# Patient Record
Sex: Male | Born: 2000 | Race: White | Hispanic: Yes | Marital: Single | Smoking: Never smoker
Health system: Southern US, Community
[De-identification: ages and names within clinical notes are randomized; demographics above are authoritative.]

## PROBLEM LIST (undated history)

## (undated) HISTORY — PX: NO PAST SURGERIES: SHX2092

---
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
## 2021-01-07 DIAGNOSIS — F330 Major depressive disorder, recurrent, mild: Secondary | ICD-10-CM | POA: Diagnosis not present

## 2021-01-14 DIAGNOSIS — F330 Major depressive disorder, recurrent, mild: Secondary | ICD-10-CM | POA: Diagnosis not present

## 2021-01-21 DIAGNOSIS — F330 Major depressive disorder, recurrent, mild: Secondary | ICD-10-CM | POA: Diagnosis not present

## 2021-01-31 DIAGNOSIS — F330 Major depressive disorder, recurrent, mild: Secondary | ICD-10-CM | POA: Diagnosis not present

## 2021-02-11 DIAGNOSIS — F330 Major depressive disorder, recurrent, mild: Secondary | ICD-10-CM | POA: Diagnosis not present

## 2021-03-03 DIAGNOSIS — F330 Major depressive disorder, recurrent, mild: Secondary | ICD-10-CM | POA: Diagnosis not present

## 2021-03-18 DIAGNOSIS — F330 Major depressive disorder, recurrent, mild: Secondary | ICD-10-CM | POA: Diagnosis not present

## 2021-03-25 DIAGNOSIS — F330 Major depressive disorder, recurrent, mild: Secondary | ICD-10-CM | POA: Diagnosis not present

## 2021-04-08 DIAGNOSIS — F330 Major depressive disorder, recurrent, mild: Secondary | ICD-10-CM | POA: Diagnosis not present

## 2021-04-15 DIAGNOSIS — F330 Major depressive disorder, recurrent, mild: Secondary | ICD-10-CM | POA: Diagnosis not present

## 2021-04-22 DIAGNOSIS — F330 Major depressive disorder, recurrent, mild: Secondary | ICD-10-CM | POA: Diagnosis not present

## 2021-04-29 DIAGNOSIS — F330 Major depressive disorder, recurrent, mild: Secondary | ICD-10-CM | POA: Diagnosis not present

## 2021-05-06 DIAGNOSIS — F330 Major depressive disorder, recurrent, mild: Secondary | ICD-10-CM | POA: Diagnosis not present

## 2021-05-20 DIAGNOSIS — F330 Major depressive disorder, recurrent, mild: Secondary | ICD-10-CM | POA: Diagnosis not present

## 2021-05-27 DIAGNOSIS — F330 Major depressive disorder, recurrent, mild: Secondary | ICD-10-CM | POA: Diagnosis not present

## 2021-06-03 DIAGNOSIS — F330 Major depressive disorder, recurrent, mild: Secondary | ICD-10-CM | POA: Diagnosis not present

## 2021-06-10 DIAGNOSIS — F330 Major depressive disorder, recurrent, mild: Secondary | ICD-10-CM | POA: Diagnosis not present

## 2021-06-17 DIAGNOSIS — F330 Major depressive disorder, recurrent, mild: Secondary | ICD-10-CM | POA: Diagnosis not present

## 2021-06-24 DIAGNOSIS — F330 Major depressive disorder, recurrent, mild: Secondary | ICD-10-CM | POA: Diagnosis not present

## 2021-07-08 DIAGNOSIS — F330 Major depressive disorder, recurrent, mild: Secondary | ICD-10-CM | POA: Diagnosis not present

## 2021-07-15 DIAGNOSIS — F330 Major depressive disorder, recurrent, mild: Secondary | ICD-10-CM | POA: Diagnosis not present

## 2021-07-26 DIAGNOSIS — F330 Major depressive disorder, recurrent, mild: Secondary | ICD-10-CM | POA: Diagnosis not present

## 2021-08-05 DIAGNOSIS — F330 Major depressive disorder, recurrent, mild: Secondary | ICD-10-CM | POA: Diagnosis not present

## 2021-08-12 DIAGNOSIS — F330 Major depressive disorder, recurrent, mild: Secondary | ICD-10-CM | POA: Diagnosis not present

## 2021-08-19 DIAGNOSIS — F330 Major depressive disorder, recurrent, mild: Secondary | ICD-10-CM | POA: Diagnosis not present

## 2021-08-22 DIAGNOSIS — F330 Major depressive disorder, recurrent, mild: Secondary | ICD-10-CM | POA: Diagnosis not present

## 2021-09-05 DIAGNOSIS — F330 Major depressive disorder, recurrent, mild: Secondary | ICD-10-CM | POA: Diagnosis not present

## 2021-09-19 DIAGNOSIS — F330 Major depressive disorder, recurrent, mild: Secondary | ICD-10-CM | POA: Diagnosis not present

## 2021-10-24 DIAGNOSIS — F330 Major depressive disorder, recurrent, mild: Secondary | ICD-10-CM | POA: Diagnosis not present

## 2021-10-31 DIAGNOSIS — F330 Major depressive disorder, recurrent, mild: Secondary | ICD-10-CM | POA: Diagnosis not present

## 2021-11-21 DIAGNOSIS — F330 Major depressive disorder, recurrent, mild: Secondary | ICD-10-CM | POA: Diagnosis not present

## 2021-11-28 DIAGNOSIS — F330 Major depressive disorder, recurrent, mild: Secondary | ICD-10-CM | POA: Diagnosis not present

## 2021-12-05 DIAGNOSIS — F330 Major depressive disorder, recurrent, mild: Secondary | ICD-10-CM | POA: Diagnosis not present

## 2021-12-12 DIAGNOSIS — F330 Major depressive disorder, recurrent, mild: Secondary | ICD-10-CM | POA: Diagnosis not present

## 2021-12-19 DIAGNOSIS — F330 Major depressive disorder, recurrent, mild: Secondary | ICD-10-CM | POA: Diagnosis not present

## 2021-12-26 DIAGNOSIS — F330 Major depressive disorder, recurrent, mild: Secondary | ICD-10-CM | POA: Diagnosis not present

## 2022-01-01 ENCOUNTER — Ambulatory Visit (INDEPENDENT_AMBULATORY_CARE_PROVIDER_SITE_OTHER): Payer: BLUE CROSS/BLUE SHIELD

## 2022-01-01 ENCOUNTER — Encounter: Payer: Self-pay | Admitting: Emergency Medicine

## 2022-01-01 ENCOUNTER — Ambulatory Visit
Admission: EM | Admit: 2022-01-01 | Discharge: 2022-01-01 | Disposition: A | Discharge status: Home / Self Care | Payer: BLUE CROSS/BLUE SHIELD | Attending: Family Medicine | Admitting: Family Medicine

## 2022-01-01 DIAGNOSIS — M25511 Pain in right shoulder: Secondary | ICD-10-CM

## 2022-01-01 DIAGNOSIS — S4991XA Unspecified injury of right shoulder and upper arm, initial encounter: Secondary | ICD-10-CM | POA: Diagnosis not present

## 2022-01-01 MED ORDER — NAPROXEN 500 MG PO TABS: 500.0000 mg | ORAL_TABLET | Freq: Two times a day (BID) | ORAL | 0 refills | Status: AC

## 2022-01-01 MED ORDER — METHOCARBAMOL 500 MG PO TABS: 500.0000 mg | ORAL_TABLET | Freq: Two times a day (BID) | ORAL | 0 refills | Status: AC

## 2022-01-01 NOTE — Discharge Instructions (Addendum)
There is no fracture or dislocation of your right shoulder seen on the x-rays.  Likely injury to the muscles during your jujitsu match.  I sent some pain medication called Naprosyn to the pharmacy.  Not take any over-the-counter medications except for Tylenol with this medication.  I also sent a muscle relaxer called Robaxin/methocarbamol.  Do not drive or operate heavy machinery while taking muscle relaxers.  Follow-up with Dr. Rosette Reveal if pain is not improved in the next 2 weeks.  Phone number: [phone].  Call to schedule an appointment.

## 2022-01-01 NOTE — ED Triage Notes (Signed)
Patient was doing a jujitsu move and hit his head on the mat and landed on his right shoulder yesterday.  Patient states that his head no longer hurts but that his right shoulder is still hurting.  Patient denies LOC.

## 2022-01-01 NOTE — ED Provider Notes (Signed)
[facility]    CSN: [id number] Arrival date & time: 01/01/22  1032      History   Chief Complaint Chief Complaint  Patient presents with   Shoulder Injury    right   Head Injury    HPI  HPI Jesse Snyder is a 21 y.o. male.   Dradenp presents for right shoulder pain after injury yesterday during jujitsu.  States that he was wrestling someone on the mat and that person landed on him with impact to his shoulder.  He is concerned that he may have dislocated it.  No abnormal Pap or sounds were heard.  Continues to have pain with movement.  He has tried over-the-counter analgesics without relief.  Has some neck tightness and mild upper back pain as well.  He just wants to be sure that he did not dislocate his shoulder.  He is right-handed.  Has no other concerns today and has otherwise been well.    History reviewed. No pertinent past medical history.  There are no problems to display for this patient.   Past Surgical History:  Procedure Laterality Date   NO PAST SURGERIES         Home Medications    Prior to Admission medications   Medication Sig Start Date End Date Taking? Authorizing Provider  methocarbamol (ROBAXIN) 500 MG tablet Take 1 tablet (500 mg total) by mouth 2 (two) times daily. 01/01/22  Yes Khristina Janota, DO  naproxen (NAPROSYN) 500 MG tablet Take 1 tablet (500 mg total) by mouth 2 (two) times daily. 01/01/22  Yes Katha Cabal, DO    Family History Family History  Problem Relation Age of Onset   Depression Mother    Healthy Father     Social History Social History   Tobacco Use   Smoking status: Never   Smokeless tobacco: Never  Vaping Use   Vaping Use: Never used  Substance Use Topics   Alcohol use: No   Drug use: No     Allergies   Patient has no known allergies.   Review of Systems Review of Systems:negative unless otherwise stated in HPI.      Physical Exam Triage Vital Signs ED Triage Vitals   Enc Vitals Group     BP 01/01/22 1117 (!) 139/93     Pulse Rate 01/01/22 1117 73     Resp 01/01/22 1117 15     Temp 01/01/22 1117 98.2 F (36.8 C)     Temp Source 01/01/22 1117 Oral     SpO2 01/01/22 1117 100 %     Weight 01/01/22 1115 190 lb (86.2 kg)     Height 01/01/22 1115 5\' 9"  (1.753 m)     Head Circumference --      Peak Flow --      Pain Score 01/01/22 1115 3     Pain Loc --      Pain Edu? --      Excl. in GC? --    No data found.  Updated Vital Signs BP (!) 139/93 (BP Location: Left Arm)   Pulse 73   Temp 98.2 F (36.8 C) (Oral)   Resp 15   Ht 5\' 9"  (1.753 m)   Wt 86.2 kg   SpO2 100%   BMI 28.06 kg/m   Visual Acuity Right Eye Distance:   Left Eye Distance:   Bilateral Distance:    Right Eye Near:   Left Eye Near:    Bilateral Near:  Physical Exam GEN: well appearing male in no acute distress  NECK: normal ROM, + paraspinal hypertonicity, no tenderness  CVS: well perfused  RESP: speaking in full sentences without pause, no respiratory distress  MSK: right shoulder:  No evidence of bony deformity, asymmetry, or muscle atrophy. No tenderness over long head of biceps (bicipital groove).  No TTP at [facility] joint.  Full active and passive (ABD, ADD, Flexion, extension, IR, ER). Strength 5/5 grip, elbow and shoulder. No abnormal scapular function observed.  Special Tests: Hawkins: Negative; Empty Can: Negative, Neer's: Negative; Painful arc: Negative; Anterior Apprehension: Negative Sensation intact. Peripheral pulses intact.   UC Treatments / Results  Labs (all labs ordered are listed, but only abnormal results are displayed) Labs Reviewed - No data to display  EKG   Radiology DG Shoulder Right  Result Date: 01/01/2022 CLINICAL DATA:  Trauma, pain EXAM: RIGHT SHOULDER - 2+ VIEW COMPARISON:  None Available. FINDINGS: There is no evidence of fracture or dislocation. There is no evidence of arthropathy or other focal bone abnormality. Soft tissues  are unremarkable. IMPRESSION: No fracture or dislocation is seen in right shoulder. Electronically Signed   By: Elmer Picker M.D.   On: 01/01/2022 11:45    Procedures Procedures (including critical care time)  Medications Ordered in UC Medications - No data to display  Initial Impression / Assessment and Plan / UC Course  I have reviewed the triage vital signs and the nursing notes.  Pertinent labs & imaging results that were available during my care of the patient were reviewed by me and considered in my medical decision making (see chart for details).     Pt is a 21 y.o.  male with 2 days of right shoulder pain after jujitsu injury.    Obtained right shoulder films.  Personally reviewed by me were  unremarkable for fracture or dislocation.   Radiologist agrees.  Patient to gradually return to normal activities, as tolerated and continue ordinary activities within the limits permitted by pain. Prescribed Naproxen sodium   and muscle relaxer   for pain relief.  Tylenol PRN. Advised patient to avoid other NSAIDs while taking Naprosyn. Counseled patient on red flag symptoms and when to seek immediate care.   No red flags such as progressive major motor weakness.   Patient to follow up with orthopedic provider if symptoms do not improve with conservative treatment.  Return and ED precautions given.   Discussed MDM, treatment plan and plan for follow-up with patient/parent who agrees with plan.   Final Clinical Impressions(s) / UC Diagnoses   Final diagnoses:  Acute pain of right shoulder     Discharge Instructions      There is no fracture or dislocation of your right shoulder seen on the x-rays.  Likely injury to the muscles during your jujitsu match.  I sent some pain medication called Naprosyn to the pharmacy.  Not take any over-the-counter medications except for Tylenol with this medication.  I also sent a muscle relaxer called Robaxin/methocarbamol.  Do not drive or  operate heavy machinery while taking muscle relaxers.  Follow-up with Dr. Rosette Reveal if pain is not improved in the next 2 weeks.  Phone number: [phone].  Call to schedule an appointment.     ED Prescriptions     Medication Sig Dispense Auth. Provider   naproxen (NAPROSYN) 500 MG tablet Take 1 tablet (500 mg total) by mouth 2 (two) times daily. 30 tablet Quashawn Jewkes, DO   methocarbamol (ROBAXIN) 500 MG  tablet Take 1 tablet (500 mg total) by mouth 2 (two) times daily. 20 tablet Katha Cabal, DO      PDMP not reviewed this encounter.   Katha Cabal, DO 01/01/22 1157

## 2022-01-09 DIAGNOSIS — F330 Major depressive disorder, recurrent, mild: Secondary | ICD-10-CM | POA: Diagnosis not present

## 2022-01-16 DIAGNOSIS — F330 Major depressive disorder, recurrent, mild: Secondary | ICD-10-CM | POA: Diagnosis not present

## 2022-01-23 DIAGNOSIS — F330 Major depressive disorder, recurrent, mild: Secondary | ICD-10-CM | POA: Diagnosis not present

## 2022-01-30 DIAGNOSIS — F330 Major depressive disorder, recurrent, mild: Secondary | ICD-10-CM | POA: Diagnosis not present

## 2022-02-06 DIAGNOSIS — F330 Major depressive disorder, recurrent, mild: Secondary | ICD-10-CM | POA: Diagnosis not present

## 2022-02-10 ENCOUNTER — Ambulatory Visit (INDEPENDENT_AMBULATORY_CARE_PROVIDER_SITE_OTHER): Payer: BLUE CROSS/BLUE SHIELD | Admitting: Family Medicine

## 2022-02-10 ENCOUNTER — Encounter: Payer: Self-pay | Admitting: Family Medicine

## 2022-02-10 VITALS — BP 128/88 | HR 90 | Ht 70.0 in | Wt 211.0 lb

## 2022-02-10 DIAGNOSIS — S4351XA Sprain of right acromioclavicular joint, initial encounter: Secondary | ICD-10-CM | POA: Diagnosis not present

## 2022-02-10 NOTE — Patient Instructions (Signed)
-   Start home exercises with information provided and focus on steady advance - Can use topical NSAID Voltaren gel 1% up to 4 times / day as-needed - Contact us towards end of this year / early next year for any persistent pain to discuss next steps - Can return to martial arts in 2 weeks if symptoms controlled / resolving / resolved

## 2022-02-13 DIAGNOSIS — F330 Major depressive disorder, recurrent, mild: Secondary | ICD-10-CM | POA: Diagnosis not present

## 2022-02-15 DIAGNOSIS — S4351XA Sprain of right acromioclavicular joint, initial encounter: Secondary | ICD-10-CM | POA: Insufficient documentation

## 2022-02-15 NOTE — Progress Notes (Signed)
     Primary Care / Sports Medicine Office Visit  Patient Information:  Patient ID: Jesse Snyder, male DOB: 2000/11/09 Age: 21 y.o. MRN: [id number]   Jesse Snyder is a pleasant 21 y.o. male presenting with the following:  Chief Complaint  Patient presents with   Shoulder Injury    Right, was doing  [employer] Jitsu and someone landed on it, went to UC was told muscle injury and to follow up here, got Covid in between     Vitals:   02/10/22 1514  BP: 128/88  Pulse: 90  SpO2: 98%   Vitals:   02/10/22 1514  Weight: 211 lb (95.7 kg)  Height: 5\' 10"  (1.778 m)   Body mass index is 30.28 kg/m.  No results found.   Independent interpretation of notes and tests performed by another provider:   Independent interpretation of right shoulder x-rays demonstrates no acute osseous process, no clavicular elevation, alignment maintained.  Procedures performed:   None  Pertinent History, Exam, Impression, and Recommendations:   Problem List Items Addressed This Visit       Musculoskeletal and Integument   Acromioclavicular sprain, right, initial encounter - Primary    RHD patient presenting with acute and traumatic right anterior shoulder pain after grappling during jiujitsu, describes impact to lateral right shoulder, pain at onset. Went to urgent care where x-rays obtained, advised follow-up. Examination localizes to [facility] joint, no crepitus, no hemielevation, RC testing and examination otherwise benign. X-rays reviewed and reassuring, no comparison view contralaterally.  I have advised supportive care, home-based rehab, and follow-up criteria.        Orders & Medications No orders of the defined types were placed in this encounter.  No orders of the defined types were placed in this encounter.    No follow-ups on file.     [facility], MD, [facility]   Primary Care Sports Medicine Primary Care and Sports Medicine at [facility]

## 2022-02-15 NOTE — Assessment & Plan Note (Signed)
RHD patient presenting with acute and traumatic right anterior shoulder pain after grappling during jiujitsu, describes impact to lateral right shoulder, pain at onset. Went to urgent care where x-rays obtained, advised follow-up. Examination localizes to [facility] joint, no crepitus, no hemielevation, RC testing and examination otherwise benign. X-rays reviewed and reassuring, no comparison view contralaterally.  I have advised supportive care, home-based rehab, and follow-up criteria.

## 2022-02-20 DIAGNOSIS — F330 Major depressive disorder, recurrent, mild: Secondary | ICD-10-CM | POA: Diagnosis not present

## 2022-02-27 DIAGNOSIS — F330 Major depressive disorder, recurrent, mild: Secondary | ICD-10-CM | POA: Diagnosis not present

## 2022-03-20 DIAGNOSIS — F330 Major depressive disorder, recurrent, mild: Secondary | ICD-10-CM | POA: Diagnosis not present

## 2022-03-27 DIAGNOSIS — F330 Major depressive disorder, recurrent, mild: Secondary | ICD-10-CM | POA: Diagnosis not present

## 2022-04-03 DIAGNOSIS — F330 Major depressive disorder, recurrent, mild: Secondary | ICD-10-CM | POA: Diagnosis not present

## 2022-04-10 DIAGNOSIS — F330 Major depressive disorder, recurrent, mild: Secondary | ICD-10-CM | POA: Diagnosis not present

## 2022-04-17 DIAGNOSIS — F330 Major depressive disorder, recurrent, mild: Secondary | ICD-10-CM | POA: Diagnosis not present

## 2022-04-24 DIAGNOSIS — F330 Major depressive disorder, recurrent, mild: Secondary | ICD-10-CM | POA: Diagnosis not present

## 2022-05-01 DIAGNOSIS — F330 Major depressive disorder, recurrent, mild: Secondary | ICD-10-CM | POA: Diagnosis not present

## 2022-05-08 DIAGNOSIS — F330 Major depressive disorder, recurrent, mild: Secondary | ICD-10-CM | POA: Diagnosis not present

## 2022-05-22 DIAGNOSIS — F330 Major depressive disorder, recurrent, mild: Secondary | ICD-10-CM | POA: Diagnosis not present

## 2022-05-29 DIAGNOSIS — F330 Major depressive disorder, recurrent, mild: Secondary | ICD-10-CM | POA: Diagnosis not present

## 2022-06-05 DIAGNOSIS — F330 Major depressive disorder, recurrent, mild: Secondary | ICD-10-CM | POA: Diagnosis not present

## 2022-06-12 DIAGNOSIS — F330 Major depressive disorder, recurrent, mild: Secondary | ICD-10-CM | POA: Diagnosis not present

## 2022-06-19 DIAGNOSIS — F330 Major depressive disorder, recurrent, mild: Secondary | ICD-10-CM | POA: Diagnosis not present

## 2022-07-17 DIAGNOSIS — F330 Major depressive disorder, recurrent, mild: Secondary | ICD-10-CM | POA: Diagnosis not present

## 2022-07-31 DIAGNOSIS — F330 Major depressive disorder, recurrent, mild: Secondary | ICD-10-CM | POA: Diagnosis not present

## 2022-08-14 DIAGNOSIS — F330 Major depressive disorder, recurrent, mild: Secondary | ICD-10-CM | POA: Diagnosis not present

## 2022-08-28 DIAGNOSIS — F330 Major depressive disorder, recurrent, mild: Secondary | ICD-10-CM | POA: Diagnosis not present

## 2022-09-25 DIAGNOSIS — F330 Major depressive disorder, recurrent, mild: Secondary | ICD-10-CM | POA: Diagnosis not present

## 2022-10-09 DIAGNOSIS — F330 Major depressive disorder, recurrent, mild: Secondary | ICD-10-CM | POA: Diagnosis not present

## 2022-10-23 DIAGNOSIS — F330 Major depressive disorder, recurrent, mild: Secondary | ICD-10-CM | POA: Diagnosis not present

## 2022-11-20 DIAGNOSIS — F330 Major depressive disorder, recurrent, mild: Secondary | ICD-10-CM | POA: Diagnosis not present

## 2022-12-04 DIAGNOSIS — F330 Major depressive disorder, recurrent, mild: Secondary | ICD-10-CM | POA: Diagnosis not present

## 2022-12-18 DIAGNOSIS — F330 Major depressive disorder, recurrent, mild: Secondary | ICD-10-CM | POA: Diagnosis not present

## 2023-01-01 DIAGNOSIS — F330 Major depressive disorder, recurrent, mild: Secondary | ICD-10-CM | POA: Diagnosis not present

## 2023-01-15 DIAGNOSIS — F330 Major depressive disorder, recurrent, mild: Secondary | ICD-10-CM | POA: Diagnosis not present

## 2023-01-29 DIAGNOSIS — F330 Major depressive disorder, recurrent, mild: Secondary | ICD-10-CM | POA: Diagnosis not present

## 2023-02-12 DIAGNOSIS — F330 Major depressive disorder, recurrent, mild: Secondary | ICD-10-CM | POA: Diagnosis not present

## 2023-02-26 DIAGNOSIS — F330 Major depressive disorder, recurrent, mild: Secondary | ICD-10-CM | POA: Diagnosis not present

## 2023-03-26 DIAGNOSIS — F330 Major depressive disorder, recurrent, mild: Secondary | ICD-10-CM | POA: Diagnosis not present

## 2023-04-09 DIAGNOSIS — F330 Major depressive disorder, recurrent, mild: Secondary | ICD-10-CM | POA: Diagnosis not present

## 2023-04-23 DIAGNOSIS — F330 Major depressive disorder, recurrent, mild: Secondary | ICD-10-CM | POA: Diagnosis not present

## 2023-05-07 DIAGNOSIS — F330 Major depressive disorder, recurrent, mild: Secondary | ICD-10-CM | POA: Diagnosis not present

## 2023-05-21 DIAGNOSIS — F330 Major depressive disorder, recurrent, mild: Secondary | ICD-10-CM | POA: Diagnosis not present

## 2023-06-04 DIAGNOSIS — F330 Major depressive disorder, recurrent, mild: Secondary | ICD-10-CM | POA: Diagnosis not present

## 2023-06-18 DIAGNOSIS — F330 Major depressive disorder, recurrent, mild: Secondary | ICD-10-CM | POA: Diagnosis not present

## 2023-07-02 DIAGNOSIS — F330 Major depressive disorder, recurrent, mild: Secondary | ICD-10-CM | POA: Diagnosis not present

## 2023-07-16 DIAGNOSIS — F330 Major depressive disorder, recurrent, mild: Secondary | ICD-10-CM | POA: Diagnosis not present

## 2023-07-30 DIAGNOSIS — F330 Major depressive disorder, recurrent, mild: Secondary | ICD-10-CM | POA: Diagnosis not present

## 2023-08-13 DIAGNOSIS — F330 Major depressive disorder, recurrent, mild: Secondary | ICD-10-CM | POA: Diagnosis not present

## 2023-08-27 DIAGNOSIS — F330 Major depressive disorder, recurrent, mild: Secondary | ICD-10-CM | POA: Diagnosis not present

## 2023-09-10 DIAGNOSIS — F330 Major depressive disorder, recurrent, mild: Secondary | ICD-10-CM | POA: Diagnosis not present

## 2023-09-24 DIAGNOSIS — F330 Major depressive disorder, recurrent, mild: Secondary | ICD-10-CM | POA: Diagnosis not present

## 2023-10-08 DIAGNOSIS — F330 Major depressive disorder, recurrent, mild: Secondary | ICD-10-CM | POA: Diagnosis not present

## 2023-10-22 DIAGNOSIS — F330 Major depressive disorder, recurrent, mild: Secondary | ICD-10-CM | POA: Diagnosis not present

## 2023-11-05 DIAGNOSIS — F330 Major depressive disorder, recurrent, mild: Secondary | ICD-10-CM | POA: Diagnosis not present

## 2023-11-19 DIAGNOSIS — F330 Major depressive disorder, recurrent, mild: Secondary | ICD-10-CM | POA: Diagnosis not present

## 2023-12-03 DIAGNOSIS — F330 Major depressive disorder, recurrent, mild: Secondary | ICD-10-CM | POA: Diagnosis not present

## 2023-12-04 ENCOUNTER — Ambulatory Visit: Admitting: Pediatrics

## 2023-12-04 ENCOUNTER — Encounter: Payer: Self-pay | Admitting: Pediatrics

## 2023-12-04 VITALS — BP 121/72 | HR 87 | Temp 98.8°F | Ht 71.0 in | Wt 204.0 lb

## 2023-12-04 DIAGNOSIS — Z131 Encounter for screening for diabetes mellitus: Secondary | ICD-10-CM | POA: Diagnosis not present

## 2023-12-04 DIAGNOSIS — Z13220 Encounter for screening for lipoid disorders: Secondary | ICD-10-CM | POA: Diagnosis not present

## 2023-12-04 DIAGNOSIS — Z1330 Encounter for screening examination for mental health and behavioral disorders, unspecified: Secondary | ICD-10-CM | POA: Diagnosis not present

## 2023-12-04 DIAGNOSIS — B354 Tinea corporis: Secondary | ICD-10-CM

## 2023-12-04 DIAGNOSIS — Z6825 Body mass index (BMI) 25.0-25.9, adult: Secondary | ICD-10-CM | POA: Insufficient documentation

## 2023-12-04 DIAGNOSIS — Z23 Encounter for immunization: Secondary | ICD-10-CM

## 2023-12-04 DIAGNOSIS — Z7689 Persons encountering health services in other specified circumstances: Secondary | ICD-10-CM

## 2023-12-04 DIAGNOSIS — Z6828 Body mass index (BMI) 28.0-28.9, adult: Secondary | ICD-10-CM

## 2023-12-04 MED ORDER — KETOCONAZOLE 2 % EX CREA: 1.0000 | TOPICAL_CREAM | Freq: Every day | CUTANEOUS | 0 refills | Status: AC

## 2023-12-04 NOTE — Progress Notes (Signed)
 Establish Care Note  BP 121/72   Pulse 87   Temp 98.8 F (37.1 C) (Oral)   Ht 5' 11 (1.803 m)   Wt 204 lb (92.5 kg)   SpO2 99%   BMI 28.45 kg/m    Subjective:    Patient ID: Jesse Snyder, male    DOB: [birth year], 23 y.o.   MRN: [id number]  HPI: Jesse Snyder is a 23 y.o. male  Chief Complaint  Patient presents with   Establish Care    Establishing care, the following was discussed today:  #tinea Has new rash for 2-3 days on right forearm He is treating w OTC lotrimin with some improvement No other breakouts/rashes  #HM Interested in updating vaccines today Not UTD on physical   Current Outpatient Medications on File Prior to Visit  Medication Sig Dispense Refill   methocarbamol  (ROBAXIN ) 500 MG tablet Take 1 tablet (500 mg total) by mouth 2 (two) times daily. (Patient not taking: Reported on 12/04/2023) 20 tablet 0   naproxen  (NAPROSYN ) 500 MG tablet Take 1 tablet (500 mg total) by mouth 2 (two) times daily. (Patient not taking: Reported on 12/04/2023) 30 tablet 0   No current facility-administered medications on file prior to visit.    #HM Will review HM records and updated as needed.  Relevant past medical, surgical, family and social history reviewed and updated as indicated. Interim medical history since our last visit reviewed. Allergies and medications reviewed and updated.  ROS per HPI unless specifically indicated above     Objective:    BP 121/72   Pulse 87   Temp 98.8 F (37.1 C) (Oral)   Ht 5' 11 (1.803 m)   Wt 204 lb (92.5 kg)   SpO2 99%   BMI 28.45 kg/m   Wt Readings from Last 3 Encounters:  12/04/23 204 lb (92.5 kg)  02/10/22 211 lb (95.7 kg)  01/01/22 190 lb (86.2 kg)     Physical Exam Constitutional:      Appearance: Normal appearance.  Cardiovascular:     Rate and Rhythm: Normal rate and regular rhythm.     Pulses: Normal pulses.     Heart sounds: Normal heart sounds.  Pulmonary:     Effort: Pulmonary  effort is normal.     Breath sounds: Normal breath sounds.  Musculoskeletal:        General: Normal range of motion.  Skin:    Comments: Well circumscribed raised erythematous ring on right from arm  Neurological:     General: No focal deficit present.     Mental Status: He is alert. Mental status is at baseline.  Psychiatric:        Mood and Affect: Mood normal.        Behavior: Behavior normal.        Thought Content: Thought content normal.         12/04/2023    1:50 PM  Depression screen PHQ 2/9  Decreased Interest 1  Down, Depressed, Hopeless 0  PHQ - 2 Score 1  Altered sleeping 0  Tired, decreased energy 0  Change in appetite 0  Feeling bad or failure about yourself  0  Trouble concentrating 0  Moving slowly or fidgety/restless 0  Suicidal thoughts 0  PHQ-9 Score 1  Difficult doing work/chores Not difficult at all        12/04/2023    1:50 PM  GAD 7 : Generalized Anxiety Score  Nervous, Anxious, on Edge 1  Control/stop worrying  0  Worry too much - different things 0  Trouble relaxing 0  Restless 0  Easily annoyed or irritable 0  Afraid - awful might happen 0  Total GAD 7 Score 1  Anxiety Difficulty Not difficult at all       Assessment & Plan:  Assessment & Plan   Tinea corporis Sending cream below if lotrimin not effective.  -     Ketoconazole ; Apply 1 Application topically daily. Use until resolution of rash. Do not use for more than 3 weeks.  Dispense: 30 g; Refill: 0  Encounter to establish care Reviewed available patient record including history, medications, problem list. HM updated as able. Will review and/or request outside records (if applicable) and will fill remaining HM gaps as needed at follow up visit.  BMI 28 Risk stratification w below labs. -     Comprehensive metabolic panel with GFR -     CBC with Differential/Platelet  Encounter for behavioral health screening As part of their intake evaluation, the patient was screened for  depression, anxiety.  PHQ9 SCORE 1, GAD7 SCORE 1. Screening results negative for tested conditions. See plan under problem/diagnosis above.   Diabetes mellitus screening -     Hemoglobin A1c  Lipid screening -     Lipid panel  Need for vaccination -     HPV 9-valent vaccine,Recombinat -     Tdap vaccine greater than or equal to 7yo IM   Follow up plan: Return in about 4 weeks (around 01/01/2024) for Physical.  [name], MD

## 2023-12-04 NOTE — Patient Instructions (Signed)

## 2023-12-05 LAB — HEMOGLOBIN A1C
Est. average glucose Bld gHb Est-mCnc: 114 mg/dL
Hgb A1c MFr Bld: 5.6 % (ref 4.8–5.6)

## 2023-12-05 LAB — CBC WITH DIFFERENTIAL/PLATELET
Basophils Absolute: 0 x10E3/uL (ref 0.0–0.2)
Basos: 1 %
EOS (ABSOLUTE): 0.2 x10E3/uL (ref 0.0–0.4)
Eos: 4 %
Hematocrit: 48.6 % (ref 37.5–51.0)
Hemoglobin: 16.4 g/dL (ref 13.0–17.7)
Immature Grans (Abs): 0 x10E3/uL (ref 0.0–0.1)
Immature Granulocytes: 0 %
Lymphocytes Absolute: 2 x10E3/uL (ref 0.7–3.1)
Lymphs: 37 %
MCH: 30.4 pg (ref 26.6–33.0)
MCHC: 33.7 g/dL (ref 31.5–35.7)
MCV: 90 fL (ref 79–97)
Monocytes Absolute: 0.3 x10E3/uL (ref 0.1–0.9)
Monocytes: 6 %
Neutrophils Absolute: 2.8 x10E3/uL (ref 1.4–7.0)
Neutrophils: 52 %
Platelets: 189 x10E3/uL (ref 150–450)
RBC: 5.4 x10E6/uL (ref 4.14–5.80)
RDW: 13.5 % (ref 11.6–15.4)
WBC: 5.4 x10E3/uL (ref 3.4–10.8)

## 2023-12-05 LAB — COMPREHENSIVE METABOLIC PANEL WITH GFR
ALT: 16 IU/L (ref 0–44)
AST: 16 IU/L (ref 0–40)
Albumin: 4.9 g/dL (ref 4.3–5.2)
Alkaline Phosphatase: 79 IU/L (ref 47–123)
BUN/Creatinine Ratio: 8 — ABNORMAL LOW (ref 9–20)
BUN: 9 mg/dL (ref 6–20)
Bilirubin Total: 0.5 mg/dL (ref 0.0–1.2)
CO2: 21 mmol/L (ref 20–29)
Calcium: 9.7 mg/dL (ref 8.7–10.2)
Chloride: 102 mmol/L (ref 96–106)
Creatinine, Ser: 1.17 mg/dL (ref 0.76–1.27)
Globulin, Total: 2.5 g/dL (ref 1.5–4.5)
Glucose: 87 mg/dL (ref 70–99)
Potassium: 4.1 mmol/L (ref 3.5–5.2)
Sodium: 141 mmol/L (ref 134–144)
Total Protein: 7.4 g/dL (ref 6.0–8.5)
eGFR: 90 mL/min/1.73 (ref >59)

## 2023-12-05 LAB — LIPID PANEL
Chol/HDL Ratio: 3 ratio (ref 0.0–5.0)
Cholesterol, Total: 196 mg/dL (ref 100–199)
HDL: 66 mg/dL (ref >39)
LDL Chol Calc (NIH): 115 mg/dL — ABNORMAL HIGH (ref 0–99)
Triglycerides: 83 mg/dL (ref 0–149)
VLDL Cholesterol Cal: 15 mg/dL (ref 5–40)

## 2023-12-06 ENCOUNTER — Ambulatory Visit: Payer: Self-pay | Admitting: Pediatrics

## 2023-12-31 DIAGNOSIS — F330 Major depressive disorder, recurrent, mild: Secondary | ICD-10-CM | POA: Diagnosis not present

## 2024-01-03 ENCOUNTER — Encounter: Payer: Self-pay | Admitting: Pediatrics

## 2024-01-03 ENCOUNTER — Ambulatory Visit (INDEPENDENT_AMBULATORY_CARE_PROVIDER_SITE_OTHER): Admitting: Pediatrics

## 2024-01-03 VITALS — BP 113/73 | HR 76 | Temp 97.6°F | Ht 71.0 in | Wt 208.4 lb

## 2024-01-03 DIAGNOSIS — Z0000 Encounter for general adult medical examination without abnormal findings: Secondary | ICD-10-CM | POA: Diagnosis not present

## 2024-01-03 DIAGNOSIS — Z1330 Encounter for screening examination for mental health and behavioral disorders, unspecified: Secondary | ICD-10-CM

## 2024-01-03 NOTE — Progress Notes (Signed)
 BP 113/73   Pulse 76   Temp 97.6 F (36.4 C) (Oral)   Ht 5' 11 (1.803 m)   Wt 208 lb 6.4 oz (94.5 kg)   SpO2 97%   BMI 29.07 kg/m    Annual Physical Exam - Male  Subjective:   CC: Annual Exam   Jesse Snyder is a 23 y.o. male patient here for a preventative health maintenance exam and has no acute complaints  Health Habits: DIET: in general, a healthy diet  , well balanced EXERCISE: active  DENTAL EXAM: UTD EYE EXAM: N/A                              Sex History:  Sexually active : N/A Barrier protection : N/A       Family History  Problem Relation Age of Onset   Depression Mother    Healthy Father                       Social History   Tobacco Use   Smoking status: Never   Smokeless tobacco: Never  Vaping Use   Vaping status: Never Used  Substance Use Topics   Alcohol use: No   Drug use: No   Social History   Social History Narrative   Not on file    Social drivers questionnaire is reviewed and is positive for : none  Depression Screening:     01/03/2024   10:47 AM 12/04/2023    1:50 PM  Depression screen PHQ 2/9  Decreased Interest 0 1  Down, Depressed, Hopeless 0 0  PHQ - 2 Score 0 1  Altered sleeping 0 0  Tired, decreased energy 0 0  Change in appetite 0 0  Feeling bad or failure about yourself  0 0  Trouble concentrating 0 0  Moving slowly or fidgety/restless 0 0  Suicidal thoughts 0 0  PHQ-9 Score 0 1  Difficult doing work/chores Not difficult at all Not difficult at all        01/03/2024   10:47 AM 12/04/2023    1:50 PM  GAD 7 : Generalized Anxiety Score  Nervous, Anxious, on Edge 0 1  Control/stop worrying 0 0  Worry too much - different things 0 0  Trouble relaxing 0 0  Restless 0 0  Easily annoyed or irritable 0 0  Afraid - awful might happen 0 0  Total GAD 7 Score 0 1  Anxiety Difficulty Not difficult at all Not difficult at all      Depression Severity and Treatment Recommendations:  0-4= None  5-9=  Mild / Treatment: Support, educate to call if worse; return in one month  10-14= Moderate / Treatment: Support, watchful waiting; Antidepressant or Psychotherapy  15-19= Moderately severe / Treatment: Antidepressant OR Psychotherapy  >= 20= Major depression, severe / Antidepressant AND Psychotherapy  Mental Health Plan: CTM  Health Maintenance Colon Cancer Screening : not applicable due to age Prostate Cancer Screening :  n/a Immunizations : up to date and documented  Self Management Goals  Goals   None     Review of Systems Per HPI      Current Outpatient Medications (Analgesics):    naproxen  (NAPROSYN ) 500 MG tablet, Take 1 tablet (500 mg total) by mouth 2 (two) times daily. (Patient not taking: Reported on 01/03/2024)   Current Outpatient Medications (Other):    ketoconazole  (NIZORAL ) 2 % cream, Apply 1  Application topically daily. Use until resolution of rash. Do not use for more than 3 weeks.   methocarbamol  (ROBAXIN ) 500 MG tablet, Take 1 tablet (500 mg total) by mouth 2 (two) times daily. (Patient not taking: Reported on 01/03/2024)   Patient Active Problem List   Diagnosis Date Noted   Acromioclavicular sprain, right, initial encounter 02/15/2022     Objective:   Vitals:   01/03/24 1042 01/03/24 1048  BP: (!) 147/78 113/73  Pulse: 78 76  Temp: 97.6 F (36.4 C)   Height: 5' 11 (1.803 m)   Weight: 208 lb 6.4 oz (94.5 kg)   SpO2: 97%   TempSrc: Oral   BMI (Calculated): 29.08     Physical Exam Constitutional:      Appearance: Normal appearance.  HENT:     Head: Normocephalic and atraumatic.  Eyes:     Pupils: Pupils are equal, round, and reactive to light.  Cardiovascular:     Rate and Rhythm: Normal rate and regular rhythm.     Pulses: Normal pulses.     Heart sounds: Normal heart sounds.  Pulmonary:     Effort: Pulmonary effort is normal.     Breath sounds: Normal breath sounds.  Abdominal:     General: Abdomen is flat.     Palpations:  Abdomen is soft.  Musculoskeletal:        General: Normal range of motion.     Cervical back: Normal range of motion.  Skin:    General: Skin is warm and dry.     Capillary Refill: Capillary refill takes less than 2 seconds.  Neurological:     General: No focal deficit present.     Mental Status: He is alert. Mental status is at baseline.  Psychiatric:        Mood and Affect: Mood normal.        Behavior: Behavior normal.     Assessment and Plan:   Annual physical exam Discussed lifestyle modifications and goals including plant based eating styles (such as: Mediterranean eating style), regular exercise (at least 150 min of moderate-intensity aerobic exercise per week, given AHA workout handout), get adequate sleep, and continue working with PCP towards meeting health goals to ensure healthy aging.   Encounter for behavioral health screening As part of their intake evaluation, the patient was screened for depression, anxiety.  PHQ9 SCORE 0, GAD7 SCORE 0. Screening results negative for tested conditions. See plan under problem/diagnosis above.   This plan was discussed with the patient and questions were answered. There were no further concerns.  Follow up as indicated, or sooner should any new problems arise, if conditions worsen, or if they are otherwise concerned.   See patient instructions for additional information.  [name], MD Family Medicine       Future Appointments  Date Time Provider Department Center  01/08/2025 11:00 AM Melvin Pao, NP [facility] [address]

## 2024-01-09 ENCOUNTER — Encounter: Payer: Self-pay | Admitting: Pediatrics

## 2024-01-14 DIAGNOSIS — F330 Major depressive disorder, recurrent, mild: Secondary | ICD-10-CM | POA: Diagnosis not present

## 2024-01-28 DIAGNOSIS — F330 Major depressive disorder, recurrent, mild: Secondary | ICD-10-CM | POA: Diagnosis not present

## 2024-02-11 DIAGNOSIS — F330 Major depressive disorder, recurrent, mild: Secondary | ICD-10-CM | POA: Diagnosis not present

## 2024-02-25 DIAGNOSIS — F330 Major depressive disorder, recurrent, mild: Secondary | ICD-10-CM | POA: Diagnosis not present

## 2025-01-08 ENCOUNTER — Encounter: Admitting: Nurse Practitioner
